# Patient Record
Sex: Female | Born: 1980 | Race: White | Hispanic: No | Marital: Single
Health system: Southern US, Community
[De-identification: ages and names within clinical notes are randomized; demographics above are authoritative.]

---

## 2018-08-18 ENCOUNTER — Emergency Department: Payer: Self-pay

## 2018-08-18 ENCOUNTER — Emergency Department
Admission: EM | Admit: 2018-08-18 | Discharge: 2018-08-18 | Disposition: A | Discharge status: Home / Self Care | Payer: Self-pay | Attending: Emergency Medicine | Admitting: Emergency Medicine

## 2018-08-18 ENCOUNTER — Encounter: Payer: Self-pay | Admitting: Emergency Medicine

## 2018-08-18 DIAGNOSIS — Z3A00 Weeks of gestation of pregnancy not specified: Secondary | ICD-10-CM | POA: Insufficient documentation

## 2018-08-18 DIAGNOSIS — O200 Threatened abortion: Secondary | ICD-10-CM | POA: Insufficient documentation

## 2018-08-18 DIAGNOSIS — N939 Abnormal uterine and vaginal bleeding, unspecified: Secondary | ICD-10-CM

## 2018-08-18 DIAGNOSIS — Z3A01 Less than 8 weeks gestation of pregnancy: Secondary | ICD-10-CM | POA: Insufficient documentation

## 2018-08-18 LAB — HCG, QUANTITATIVE, PREGNANCY: hCG, Beta Chain, Quant, S: 7840 m[IU]/mL — ABNORMAL HIGH (ref <5)

## 2018-08-18 LAB — ABO/RH: ABO/RH(D): B POS

## 2018-08-18 NOTE — Discharge Instructions (Addendum)
Please seek medical attention for any high fevers, chest pain, shortness of breath, change in behavior, persistent vomiting, bloody stool or any other new or concerning symptoms.  

## 2018-08-18 NOTE — ED Notes (Signed)
Pt ambulated to US

## 2018-08-18 NOTE — ED Triage Notes (Signed)
Patient presents to the ED with vaginal bleeding x 2 days.  Patient states she should be [redacted] weeks pregnant tomorrow.  Patient states bleeding began after intercourse.  Patient reports having to change pads every few hours for approx. 24 hours and now is just having spotting.

## 2018-08-18 NOTE — ED Notes (Signed)
Called lab about adding on ABO/Rh. Reported they can add on test

## 2018-08-18 NOTE — ED Provider Notes (Signed)
[facility] Emergency Department Provider Note   ____________________________________________   I have reviewed the triage vital signs and the nursing notes.   HISTORY  Chief Complaint Vaginal Bleeding   History limited by: Not Limited   HPI Kim Small is a 37 y.o. female who presents to the emergency department today because of concerns for vaginal bleeding and abdominal cramping.  Patient states that she started having vaginal bleeding yesterday.  At this time it is simply spotting.  This has been accompanied by some abdominal cramping.  Slightly worse on the left suprapubic region.  Patient states that she thinks she is about [redacted] weeks pregnant by dates.     History reviewed. No pertinent past medical history.  There are no active problems to display for this patient.   History reviewed. No pertinent surgical history.  Prior to Admission medications   Not on File    Allergies Patient has no known allergies.  No family history on file.  Social History Social History   Tobacco Use  . Smoking status: Not on file  Substance Use Topics  . Alcohol use: Not on file  . Drug use: Not on file    Review of Systems Constitutional: No fever/chills Eyes: No visual changes. ENT: No sore throat. Cardiovascular: Denies chest pain. Respiratory: Denies shortness of breath. Gastrointestinal: Positive for abdominal cramping Genitourinary: Positive for vaginal bleeding Musculoskeletal: Negative for back pain. Skin: Negative for rash. Neurological: Negative for headaches, focal weakness or numbness.  ____________________________________________   PHYSICAL EXAM:  VITAL SIGNS: ED Triage Vitals  Enc Vitals Group     BP 08/18/18 1241 103/67     Pulse Rate 08/18/18 1241 66     Resp 08/18/18 1241 16     Temp 08/18/18 1241 98.9 F (37.2 C)     Temp Source 08/18/18 1241 Oral     SpO2 08/18/18 1241 100 %     Weight 08/18/18 1244 130 lb (59 kg)   Height 08/18/18 1244 5\' 8"  (1.727 m)     Head Circumference --      Peak Flow --      Pain Score 08/18/18 1244 0   Constitutional: Alert and oriented.  Eyes: Conjunctivae are normal.  ENT      Head: Normocephalic and atraumatic.      Nose: No congestion/rhinnorhea.      Mouth/Throat: Mucous membranes are moist.      Neck: No stridor. Hematological/Lymphatic/Immunilogical: No cervical lymphadenopathy. Cardiovascular: Normal rate, regular rhythm.  No murmurs, rubs, or gallops.  Respiratory: Normal respiratory effort without tachypnea nor retractions. Breath sounds are clear and equal bilaterally. No wheezes/rales/rhonchi. Gastrointestinal: Soft and non tender. No rebound. No guarding.  Genitourinary: Deferred Musculoskeletal: Normal range of motion in all extremities. No lower extremity edema. Neurologic:  Normal speech and language. No gross focal neurologic deficits are appreciated.  Skin:  Skin is warm, dry and intact. No rash noted. Psychiatric: Mood and affect are normal. Speech and behavior are normal. Patient exhibits appropriate insight and judgment.  ____________________________________________    LABS (pertinent positives/negatives)  bhcg 7840 B POS  ____________________________________________   EKG  None  ____________________________________________    RADIOLOGY  [place] IUP at 6 weeks, heart rate 99  ____________________________________________   PROCEDURES  Procedures  ____________________________________________   INITIAL IMPRESSION / ASSESSMENT AND PLAN / ED COURSE  Pertinent labs & imaging results that were available during my care of the patient were reviewed by me and considered in my medical decision making (see  chart for details).   Patient presented because of concern for abdominal cramping and vaginal bleeding in the setting of an early pregnancy. The patient had US performed which shows an IUP. Discussed this finding as well as concern for  chorionic bump and increased risk of miscarriage. Discussed importance of close Ob/gyn follow up. Patient states she is moving out of town next week and will find ob/gyn in new location.   ____________________________________________   FINAL CLINICAL IMPRESSION(S) / ED DIAGNOSES  Final diagnoses:  Threatened miscarriage     Note: This dictation was prepared with Dragon dictation. Any transcriptional errors that result from this process are unintentional     Phineas Semen, MD 08/18/18 1630

## 2018-08-18 NOTE — ED Notes (Signed)
Pt still in US

## 2018-09-28 IMAGING — US US OB < 14 WEEKS - US OB TV
2 series · 13 of 28 positions shown · non-contrast
Comparison: None.

CLINICAL DATA: Vaginal bleeding and abdominal cramping for 2 days.
Quantitative beta HCG is 1934.

EXAM:
OBSTETRIC <14 WK ULTRASOUND
TECHNIQUE: Transabdominal ultrasound was performed for evaluation of the
gestation as well as the maternal uterus and adnexal regions.

[Series 1: us ob < 14 weeks - us ob tv · 0.22mm/px · 3 of 19 slices shown (1 of 2)]
[im 4/19]
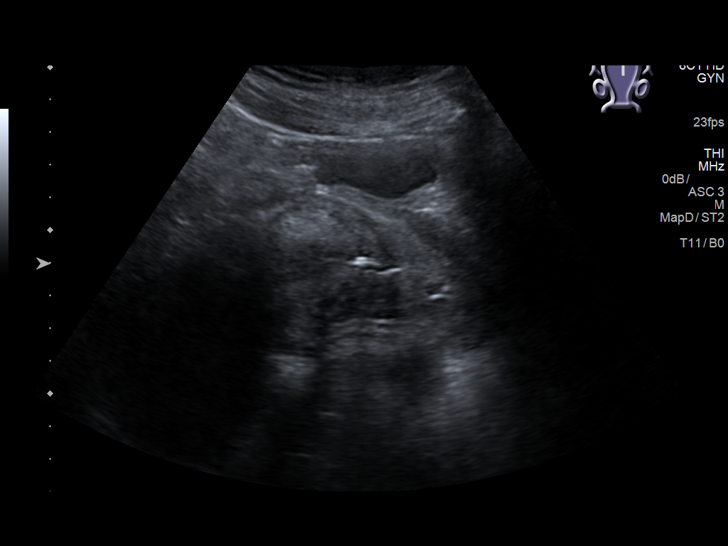
[im 11/19]
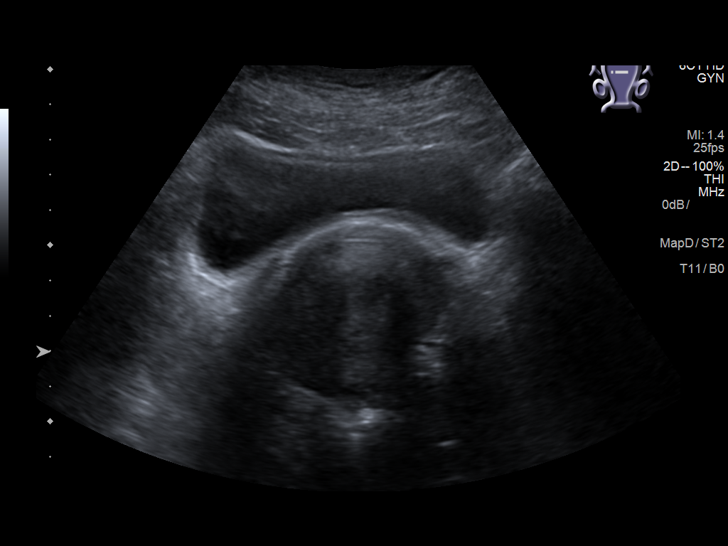
[im 19/19]
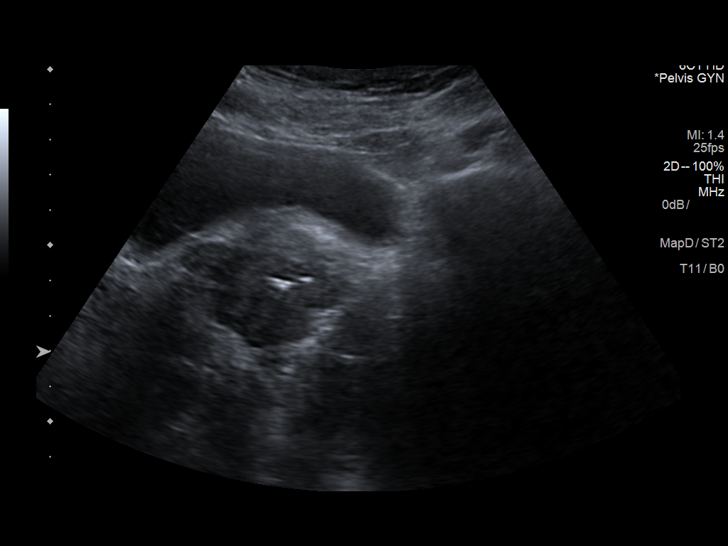

[Series 1001: us ob < 14 weeks - us ob tv · 0.10mm/px · 63 acquisitions, 10 frames shown (2 of 2)]
[im 3/63]
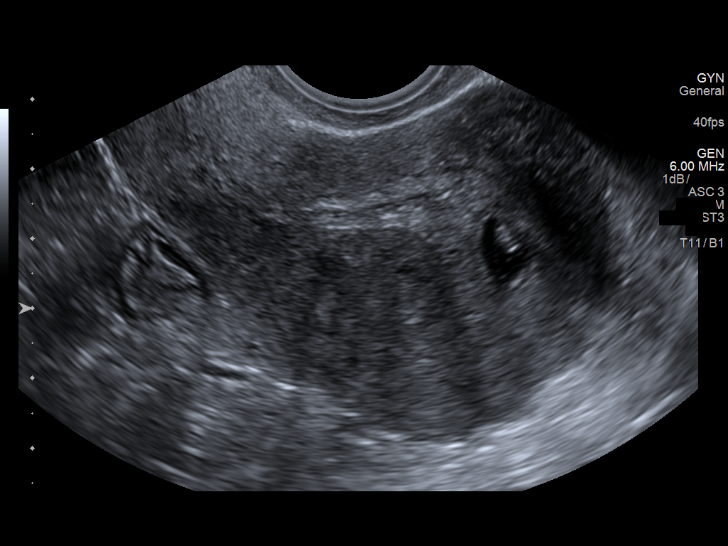
[im 9/63]
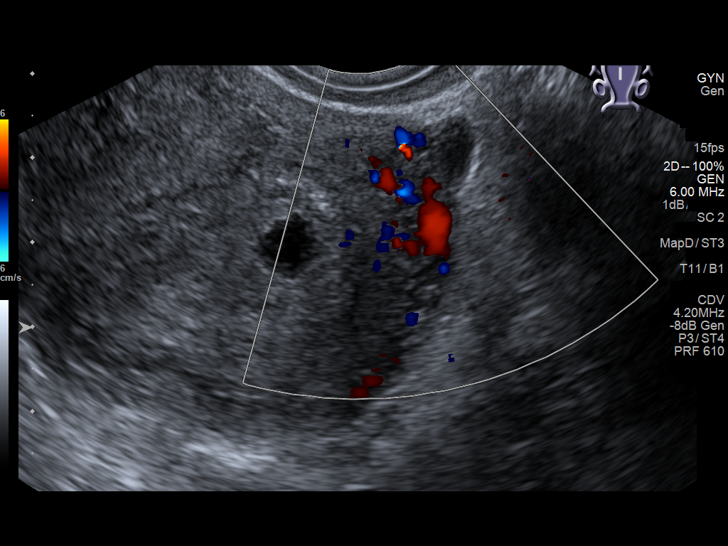
[im 15/63]
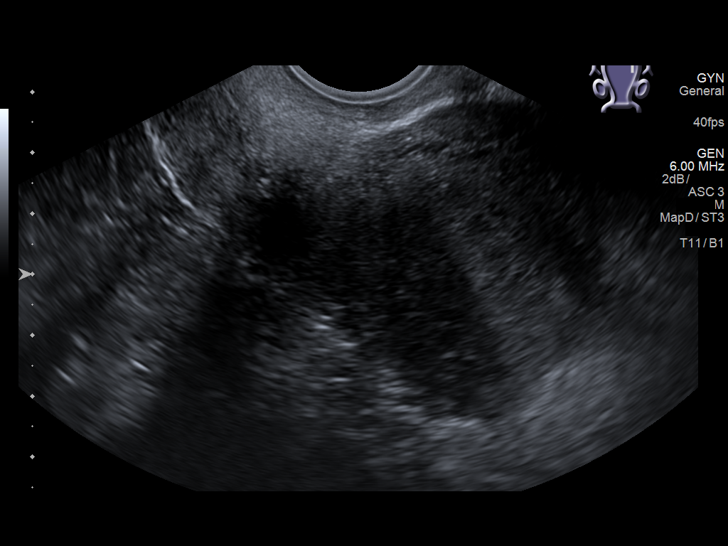
[im 24/63]
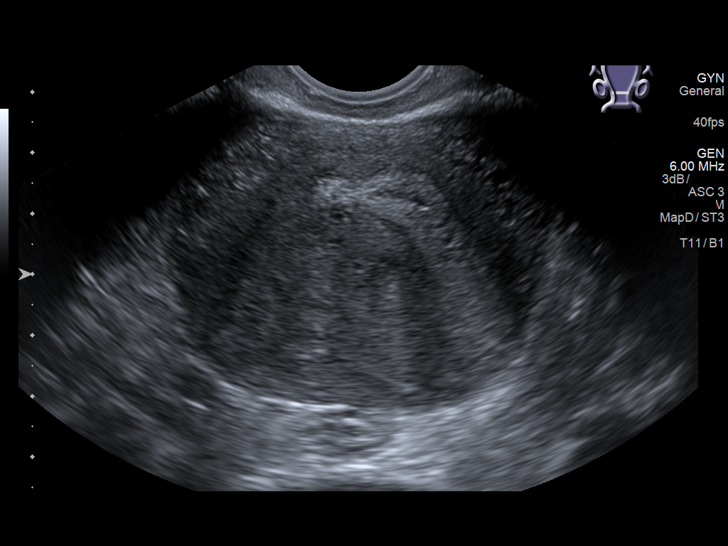
[im 30/63]
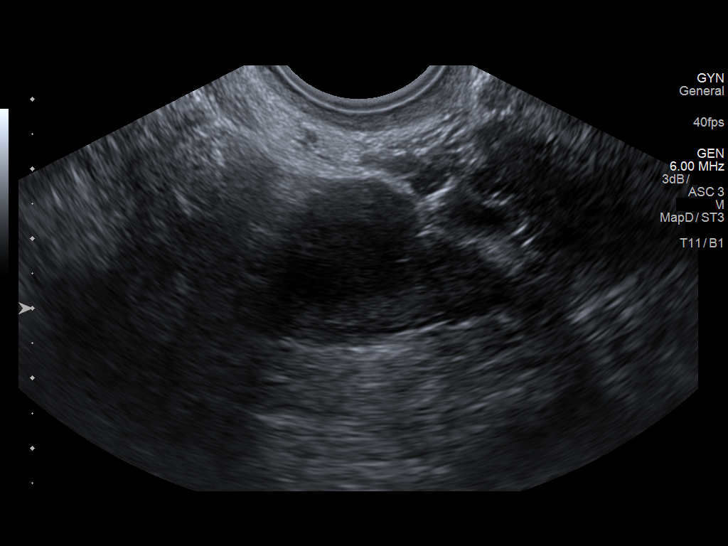
[im 36/63]
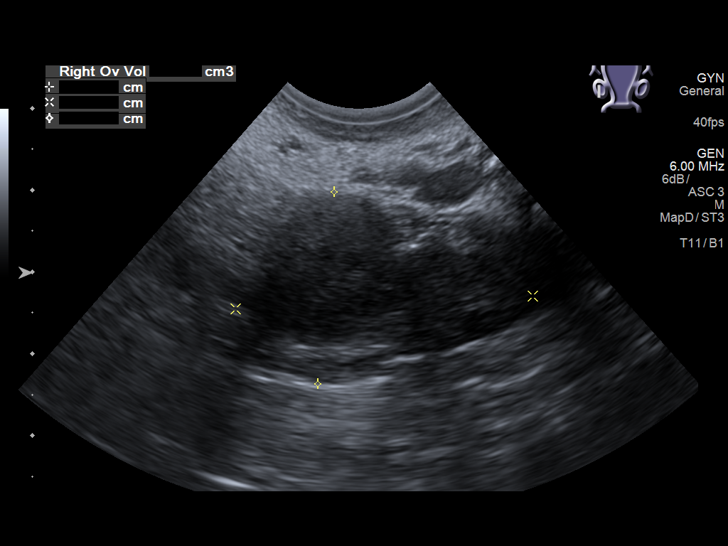
[im 42/63]
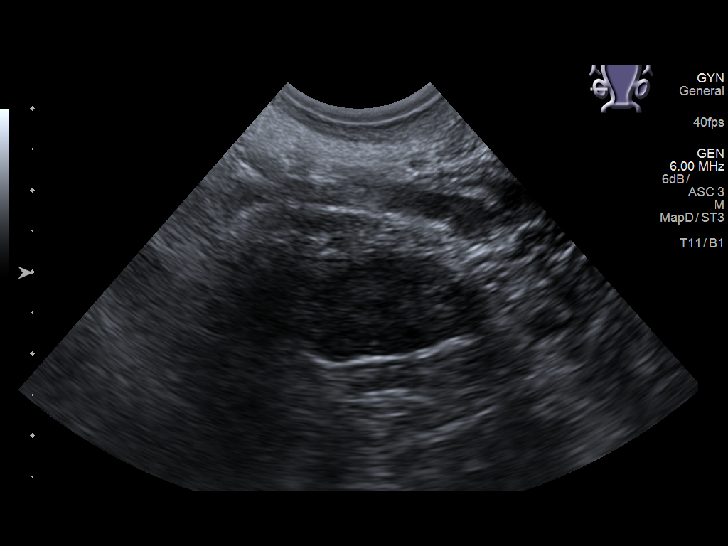
[im 48/63]
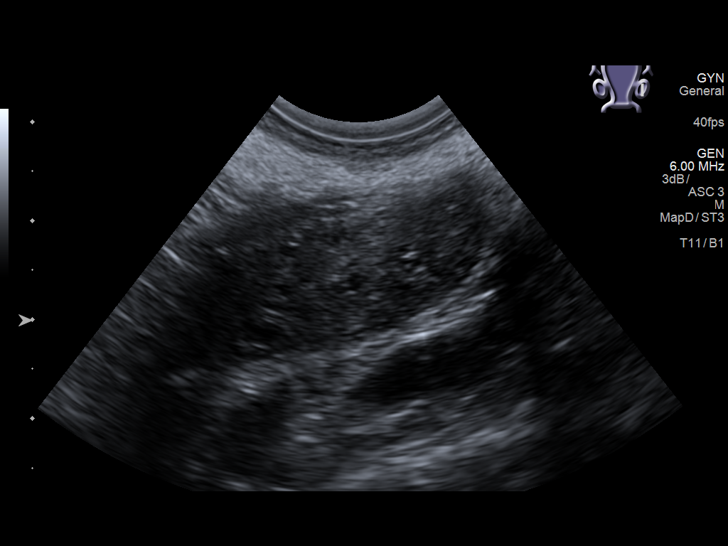
[im 54/63]
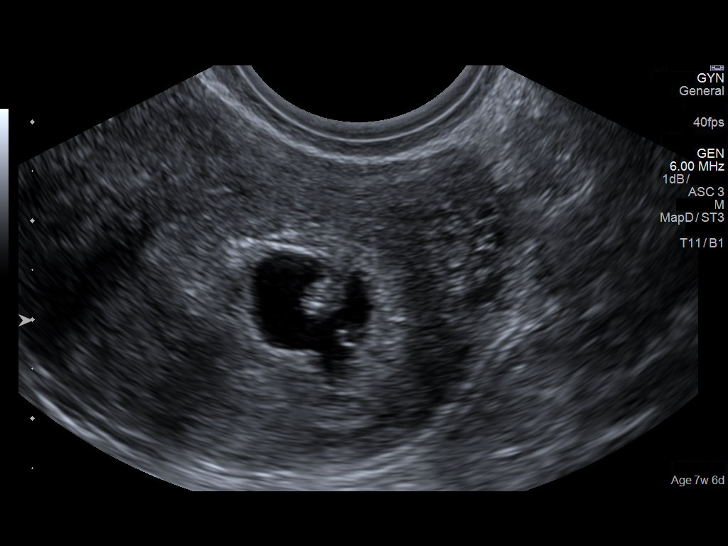
[im 60/63]
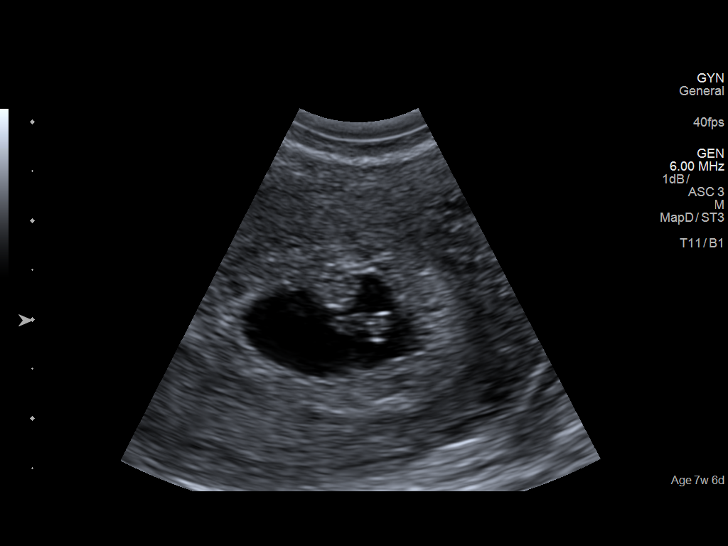

[13 of 28 positions shown; findings below may reference images not displayed]

FINDINGS: Intrauterine gestational sac: Single

Yolk sac:  Visualized.

Embryo:  Visualized.  There is question of chorionic bump.

Cardiac Activity: Visualized.

Heart Rate: 99 bpm

CRL:   3.6 mm   6 w 0 d                  US EDC: 04/13/2019

Subchorionic hemorrhage:  None visualized.

Maternal uterus/adnexae: The gestational sac is within the LEFT
aspect of the uterus. Morphology of the uterus suggests arcuate
versus septate uterus.

Corpus luteum cyst on the RIGHT is 1.7 x 1.9 x 1.7 centimeters. LEFT
ovary is normal in appearance. Trace free pelvic fluid.
IMPRESSION: 1. Single living intrauterine embryo measuring 6 weeks 0 days.
2. Slow embryonic cardiac rate of 99 beats per minute.
3. Question of chorionic bump. Chorionic bowel can be associated
poor prognosis.
4. Followup ultrasound is suggested in 10-14 days to evaluate for
perforated growth and cardiac activity.
